# Patient Record
Sex: Male | Born: 1965 | Race: White | Hispanic: No | Smoking: Never smoker
Health system: Southern US, Community
[De-identification: ages and names within clinical notes are randomized; demographics above are authoritative.]

## PROBLEM LIST (undated history)

## (undated) DIAGNOSIS — G4700 Insomnia, unspecified: Secondary | ICD-10-CM

## (undated) DIAGNOSIS — Z8719 Personal history of other diseases of the digestive system: Secondary | ICD-10-CM

## (undated) DIAGNOSIS — E785 Hyperlipidemia, unspecified: Secondary | ICD-10-CM

## (undated) DIAGNOSIS — T7840XA Allergy, unspecified, initial encounter: Secondary | ICD-10-CM

## (undated) DIAGNOSIS — N5089 Other specified disorders of the male genital organs: Secondary | ICD-10-CM

## (undated) HISTORY — DX: Allergy, unspecified, initial encounter: T78.40XA

## (undated) HISTORY — DX: Hyperlipidemia, unspecified: E78.5

---

## 1988-09-16 HISTORY — PX: OTHER SURGICAL HISTORY: SHX169

## 1998-09-16 HISTORY — PX: KNEE ARTHROSCOPY: SUR90

## 2009-02-22 ENCOUNTER — Ambulatory Visit: Payer: Self-pay | Admitting: Gastroenterology

## 2009-02-22 DIAGNOSIS — K625 Hemorrhage of anus and rectum: Secondary | ICD-10-CM

## 2009-03-07 ENCOUNTER — Ambulatory Visit: Payer: Self-pay | Admitting: Gastroenterology

## 2011-12-24 ENCOUNTER — Other Ambulatory Visit: Payer: Self-pay | Admitting: Urology

## 2012-01-16 ENCOUNTER — Encounter (HOSPITAL_BASED_OUTPATIENT_CLINIC_OR_DEPARTMENT_OTHER): Payer: Self-pay | Admitting: *Deleted

## 2012-01-16 NOTE — Progress Notes (Signed)
NPO AFTER MN. ARRIVES AT 0830. NEEDS HG AND EKG. 

## 2012-01-17 NOTE — H&P (Signed)
History of Present Illness   Mr. Ruhlman presents today because of some acute scrotal swelling. The patient is 46 years of age. In the past he was seen for some nonspecific voiding issues which basically have resolved. Approximately 3 days ago he noticed a fairly painless swelling of his right hemi-scrotum. He went to an Urgent Care where he was told this was probably a "hydrocele". There has not been any dramatic change over the last few days. He has had mild discomfort attributable to the enlargement of the hemi-scrotum. He otherwise has not had any obvious pain. He has had no change in voiding patterns. There has been nothing to suggest epididymal orchitis. Urinalysis today is clear. No other complaints or issues at this point.      Past Medical History Problems  1. History of  No Medical Problems  Surgical History Problems  1. History of  Knee Surgery 2. History of  Shoulder Surgery  Current Meds 1. No Reported Medications  Allergies Medication  1. No Known Drug Allergies  Family History Problems  1. Family history of  No Significant Family History  Social History Problems  1. Caffeine Use 2 2. Marital History - Single 3. Never A Smoker 4. Occupation: Presenter, broadcasting  5. History of  Alcohol Use 6. History of  Tobacco Use  Review of Systems Genitourinary, constitutional, skin, eye, otolaryngeal, hematologic/lymphatic, cardiovascular, pulmonary, endocrine, musculoskeletal, gastrointestinal, neurological and psychiatric system(s) were reviewed and pertinent findings if present are noted.  Genitourinary: testicular mass, scrotal pain and scrotal swelling.    Vitals Vital Signs [Data Includes: Last 1 Day]  08Apr2013 03:38PM  BMI Calculated: 27.59 BSA Calculated: 2.24 Height: 6 ft 2 in Weight: 215 lb  Blood Pressure: 141 / 91 Temperature: 97 F Heart Rate: 57  Physical Exam Constitutional: Well nourished and well developed . No acute distress.  ENT:. The ears  and nose are normal in appearance.  Neck: The appearance of the neck is normal and no neck mass is present.  Pulmonary: No respiratory distress and normal respiratory rhythm and effort.  Cardiovascular: Heart rate and rhythm are normal . No peripheral edema.  Abdomen: The abdomen is soft and nontender. No masses are palpated. No CVA tenderness. No hernias are palpable. No hepatosplenomegaly noted.  Genitourinary: Examination of the penis demonstrates no discharge, no masses, no lesions and a normal meatus. The scrotum is without lesions. Examination of the right scrotum demonstrates a hydrocele. The right epididymis is found to have a spermatocele, but non-tender. The left epididymis is palpably normal and non-tender. The right testis is nonpalpable, non-tender and without masses. The left testis is normal, non-tender and without masses.  Skin: Normal skin turgor, no visible rash and no visible skin lesions.  Neuro/Psych:. Mood and affect are appropriate.    Results/Data Urine [Data Includes: Last 1 Day]   08Apr2013  COLOR STRAW   APPEARANCE CLEAR   SPECIFIC GRAVITY <1.005   pH 6.0   GLUCOSE NEG mg/dL  BILIRUBIN NEG   KETONE NEG mg/dL  BLOOD NEG   PROTEIN NEG mg/dL  UROBILINOGEN 0.2 mg/dL  NITRITE NEG   LEUKOCYTE ESTERASE NEG    Assessment Assessed  1. 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 Spermatocele 608.1 3. Testicular Pain 608.9 4. Inquiry And Counseling: Contracept. Natural Family Planning V25.09  Plan Spermatocele (608.1)  1. SCROTAL U/S  Done: 08Apr2013 12:00AM Testicular Hydrocele (603.9)  2. Follow-up Schedule Surgery Office  Follow-up  Done: 08Apr2013  Discussion/Summary   Mr. Ballinas presented with fairly acute  onset of right scrotal swelling. It is quite possible that this was somewhat swollen previously and became more acutely swollen over the last several days. He has not really had any significant pain or discomfort associated with this. On clinical exam one could feel  the posterior aspect to the testicle but not the entire testicle. This was more consistent with a large spermatocele versus a hydrocele. On ultrasonography, the patient clearly has a multiloculated fluid/cystic area superior to the right testicle and surrounding it except inferiorly and posteriorly. This measures as large as close to 6 cm. Both clinically as well as by ultrasound this is more consistent with a large spermatocele but a hydrocele is also a possibility. The underlying testicle looks normal and we are dealing with a benign process. Mr. Kenneth Fernandez is bothered by the scrotal swelling. He is told that this is unlikely to completely resolve on its own but I certainly think there is a chance and he ought to give this a couple of weeks to see if the swelling will indeed decrease. If not, then scrotal exploration with either hydrocele repair or excision of spermatocele can be performed. We did discuss the surgery and what is done in both cases. We talked a bit about postoperative recovery and issues/pros and cons of a surgical approach. He understands that nothing is absolutely essential for hydroceles or spermatocele, but when things are this large a surgical repair/correction is certainly quite a reasonable option and again, he is interested in that.    A total of 30 minutes were spent in the overall care of the patient today with 25 minutes in direct face to face consultation.  The patient was thoroughly counseled regarding permanent sterilization with vasectomy. The nature of the procedure itself and the periprocedure instructions were explained. The risks/potential complications and alternative options were discussed with the patient. Specific risks of vasectomy including but not limited to bleeding, infection, failure of the procedure/recanalization, sperm granuloma formation, epididymitis, chronic testicular pain, damage to other scrotal structures, etc. were discussed. The patient was also counseled that  two negative semen analyses were required after vasectomy before it would be appropriate to discontinue other forms of contraception.    Amendment  Pt. expressed interest in possible vas if above surgery is performed. Surgery is likely based on talk today.

## 2012-01-20 ENCOUNTER — Encounter (HOSPITAL_BASED_OUTPATIENT_CLINIC_OR_DEPARTMENT_OTHER)
Admission: RE | Disposition: A | Discharge status: Home / Self Care | Payer: Self-pay | Source: Ambulatory Visit | Attending: Urology

## 2012-01-20 ENCOUNTER — Encounter (HOSPITAL_BASED_OUTPATIENT_CLINIC_OR_DEPARTMENT_OTHER): Payer: Self-pay | Admitting: *Deleted

## 2012-01-20 ENCOUNTER — Encounter (HOSPITAL_BASED_OUTPATIENT_CLINIC_OR_DEPARTMENT_OTHER): Payer: Self-pay | Admitting: Anesthesiology

## 2012-01-20 ENCOUNTER — Ambulatory Visit (HOSPITAL_BASED_OUTPATIENT_CLINIC_OR_DEPARTMENT_OTHER)
Admission: RE | Admit: 2012-01-20 | Discharge: 2012-01-20 | Disposition: A | Discharge status: Home / Self Care | Payer: Managed Care, Other (non HMO) | Source: Ambulatory Visit | Attending: Urology | Admitting: Urology

## 2012-01-20 ENCOUNTER — Ambulatory Visit (HOSPITAL_BASED_OUTPATIENT_CLINIC_OR_DEPARTMENT_OTHER): Payer: Managed Care, Other (non HMO) | Admitting: Anesthesiology

## 2012-01-20 DIAGNOSIS — N4340 Spermatocele of epididymis, unspecified: Secondary | ICD-10-CM | POA: Diagnosis present

## 2012-01-20 DIAGNOSIS — N433 Hydrocele, unspecified: Secondary | ICD-10-CM | POA: Diagnosis present

## 2012-01-20 DIAGNOSIS — Z302 Encounter for sterilization: Secondary | ICD-10-CM | POA: Insufficient documentation

## 2012-01-20 DIAGNOSIS — N432 Other hydrocele: Secondary | ICD-10-CM | POA: Insufficient documentation

## 2012-01-20 HISTORY — DX: Other specified disorders of the male genital organs: N50.89

## 2012-01-20 HISTORY — PX: SPERMATOCELECTOMY: SHX2420

## 2012-01-20 HISTORY — PX: SCROTAL EXPLORATION: SHX2386

## 2012-01-20 HISTORY — PX: HYDROCELE EXCISION: SHX482

## 2012-01-20 HISTORY — DX: Insomnia, unspecified: G47.00

## 2012-01-20 HISTORY — PX: VASECTOMY: SHX75

## 2012-01-20 HISTORY — DX: Personal history of other diseases of the digestive system: Z87.19

## 2012-01-20 SURGERY — EXPLORATION, SCROTUM
Anesthesia: General | Site: Scrotum | Laterality: Right | Wound class: Clean

## 2012-01-20 MED ORDER — DEXAMETHASONE SODIUM PHOSPHATE 4 MG/ML IJ SOLN
INTRAMUSCULAR | Status: DC | PRN
  Administered 2012-01-20: 10 mg via INTRAVENOUS

## 2012-01-20 MED ORDER — BUPIVACAINE HCL (PF) 0.25 % IJ SOLN
INTRAMUSCULAR | Status: DC | PRN
  Administered 2012-01-20: 4 mL

## 2012-01-20 MED ORDER — CEFAZOLIN SODIUM-DEXTROSE 2-3 GM-% IV SOLR
2.0000 g | INTRAVENOUS | Status: AC
  Administered 2012-01-20: 2 g via INTRAVENOUS

## 2012-01-20 MED ORDER — LACTATED RINGERS IV SOLN
INTRAVENOUS | Status: DC
  Administered 2012-01-20 (×2): via INTRAVENOUS

## 2012-01-20 MED ORDER — MIDAZOLAM HCL 5 MG/5ML IJ SOLN
INTRAMUSCULAR | Status: DC | PRN
  Administered 2012-01-20: 2 mg via INTRAVENOUS

## 2012-01-20 MED ORDER — FENTANYL CITRATE 0.05 MG/ML IJ SOLN: 25.0000 ug | INTRAMUSCULAR | Status: DC | PRN

## 2012-01-20 MED ORDER — CEFAZOLIN SODIUM 1-5 GM-% IV SOLN: 1.0000 g | INTRAVENOUS | Status: DC

## 2012-01-20 MED ORDER — LACTATED RINGERS IV SOLN: INTRAVENOUS | Status: DC

## 2012-01-20 MED ORDER — PROMETHAZINE HCL 25 MG/ML IJ SOLN: 6.2500 mg | INTRAMUSCULAR | Status: DC | PRN

## 2012-01-20 MED ORDER — ONDANSETRON HCL 4 MG/2ML IJ SOLN
INTRAMUSCULAR | Status: DC | PRN
  Administered 2012-01-20: 4 mg via INTRAVENOUS

## 2012-01-20 MED ORDER — GLYCOPYRROLATE 0.2 MG/ML IJ SOLN
INTRAMUSCULAR | Status: DC | PRN
  Administered 2012-01-20 (×2): 0.2 mg via INTRAVENOUS

## 2012-01-20 MED ORDER — PROPOFOL 10 MG/ML IV EMUL
INTRAVENOUS | Status: DC | PRN
  Administered 2012-01-20: 300 mg via INTRAVENOUS

## 2012-01-20 MED ORDER — FENTANYL CITRATE 0.05 MG/ML IJ SOLN
INTRAMUSCULAR | Status: DC | PRN
  Administered 2012-01-20: 50 ug via INTRAVENOUS
  Administered 2012-01-20: 100 ug via INTRAVENOUS

## 2012-01-20 MED ORDER — LIDOCAINE HCL (CARDIAC) 20 MG/ML IV SOLN
INTRAVENOUS | Status: DC | PRN
  Administered 2012-01-20: 100 mg via INTRAVENOUS

## 2012-01-20 MED ORDER — MEPERIDINE HCL 25 MG/ML IJ SOLN: 6.2500 mg | INTRAMUSCULAR | Status: DC | PRN

## 2012-01-20 MED ORDER — OXYCODONE-ACETAMINOPHEN 5-500 MG PO CAPS: 1.0000 | ORAL_CAPSULE | ORAL | Status: AC | PRN

## 2012-01-20 SURGICAL SUPPLY — 53 items
APPLICATOR COTTON TIP 6IN STRL (MISCELLANEOUS) ×3 IMPLANT
BANDAGE GAUZE ELAST BULKY 4 IN (GAUZE/BANDAGES/DRESSINGS) ×3 IMPLANT
BENZOIN TINCTURE PRP APPL 2/3 (GAUZE/BANDAGES/DRESSINGS) ×3 IMPLANT
BLADE SURG 10 STRL SS (BLADE) ×3 IMPLANT
BLADE SURG 15 STRL LF DISP TIS (BLADE) ×2 IMPLANT
BLADE SURG 15 STRL SS (BLADE) ×1
BLADE SURG ROTATE 9660 (MISCELLANEOUS) IMPLANT
CANISTER SUCTION 1200CC (MISCELLANEOUS) ×3 IMPLANT
CANISTER SUCTION 2500CC (MISCELLANEOUS) IMPLANT
CLEANER CAUTERY TIP 5X5 PAD (MISCELLANEOUS) ×2 IMPLANT
CLOTH BEACON ORANGE TIMEOUT ST (SAFETY) ×3 IMPLANT
COVER MAYO STAND STRL (DRAPES) ×3 IMPLANT
COVER TABLE BACK 60X90 (DRAPES) ×3 IMPLANT
DISSECTOR ROUND CHERRY 3/8 STR (MISCELLANEOUS) IMPLANT
DRAIN PENROSE 18X1/4 LTX STRL (WOUND CARE) IMPLANT
DRAPE PED LAPAROTOMY (DRAPES) ×3 IMPLANT
DRSG TEGADERM 2-3/8X2-3/4 SM (GAUZE/BANDAGES/DRESSINGS) ×3 IMPLANT
DRSG TEGADERM 4X4.75 (GAUZE/BANDAGES/DRESSINGS) ×3 IMPLANT
ELECT NEEDLE TIP 2.8 STRL (NEEDLE) ×3 IMPLANT
ELECT REM PT RETURN 9FT ADLT (ELECTROSURGICAL) ×3
ELECTRODE REM PT RTRN 9FT ADLT (ELECTROSURGICAL) ×2 IMPLANT
GLOVE BIO SURGEON STRL SZ7.5 (GLOVE) ×3 IMPLANT
GOWN W/2 COTTON TOWELS 2 STD (GOWNS) ×3 IMPLANT
GOWN W/COTTON TOWEL STD LRG (GOWNS) ×3 IMPLANT
GOWN XL W/COTTON TOWEL STD (GOWNS) ×3 IMPLANT
LOOP VESSEL MAXI BLUE (MISCELLANEOUS) IMPLANT
NEEDLE HYPO 22GX1.5 SAFETY (NEEDLE) ×3 IMPLANT
NEEDLE HYPO 25X1 1.5 SAFETY (NEEDLE) ×3 IMPLANT
NS IRRIG 500ML POUR BTL (IV SOLUTION) ×3 IMPLANT
PACK BASIN DAY SURGERY FS (CUSTOM PROCEDURE TRAY) ×3 IMPLANT
PAD CLEANER CAUTERY TIP 5X5 (MISCELLANEOUS) ×1
PENCIL BUTTON HOLSTER BLD 10FT (ELECTRODE) ×3 IMPLANT
SUPPORT SCROTAL LG STRP (MISCELLANEOUS) ×3 IMPLANT
SUT MNCRL AB 4-0 PS2 18 (SUTURE) ×3 IMPLANT
SUT SILK 0 SH 30 (SUTURE) IMPLANT
SUT SILK 0 TIES 10X30 (SUTURE) ×3 IMPLANT
SUT SILK 3 0 SH 30 (SUTURE) IMPLANT
SUT VIC AB 2-0 CT1 27 (SUTURE)
SUT VIC AB 2-0 CT1 TAPERPNT 27 (SUTURE) IMPLANT
SUT VIC AB 3-0 CT1 36 (SUTURE) IMPLANT
SUT VIC AB 3-0 SH 27 (SUTURE) ×2
SUT VIC AB 3-0 SH 27X BRD (SUTURE) ×4 IMPLANT
SUT VIC AB 4-0 SH 27 (SUTURE) ×1
SUT VIC AB 4-0 SH 27XANBCTRL (SUTURE) ×2 IMPLANT
SUT VICRYL 0 TIES 12 18 (SUTURE) ×3 IMPLANT
SUT VICRYL 4-0 PS2 18IN ABS (SUTURE) ×3 IMPLANT
SYR BULB IRRIGATION 50ML (SYRINGE) ×3 IMPLANT
SYR CONTROL 10ML LL (SYRINGE) ×3 IMPLANT
TOWEL OR 17X24 6PK STRL BLUE (TOWEL DISPOSABLE) ×6 IMPLANT
TRAY DSU PREP LF (CUSTOM PROCEDURE TRAY) ×3 IMPLANT
TUBE CONNECTING 12X1/4 (SUCTIONS) ×3 IMPLANT
WATER STERILE IRR 500ML POUR (IV SOLUTION) IMPLANT
YANKAUER SUCT BULB TIP NO VENT (SUCTIONS) ×3 IMPLANT

## 2012-01-20 NOTE — Op Note (Signed)
Preoperative diagnosis: Right scrotal swelling possible hydrocele/possible spermatocele, request for elective sterilization Postoperative diagnosis: Same  Procedure: Scrotal exploration with repair of right hydrocele and excision of large right spermatocele, bilateral vasectomy   Surgeon: Valetta Fuller M.D.  Anesthesia: Gen.  Indications: The patient was seen in our office after developing are fairly acute right scrotal swelling. Ultrasound clearly showed a significant amount of fluid around the right testicle. By clinical exam as well as ultrasound was difficult to determine how much of this was a spermatocele versus a hydrocele. The patient elected to have this surgically corrected. He appeared to understand the advantages and disadvantages was potential risks. He also requested a bilateral vasectomy. Issues with regard to vasectomy were also discussed in detail.     Technique and findings: Patient was brought to the operating room where he had successful induction general anesthesia. He was placed in supine position prepped and draped in usual manner. PAS compression boots were utilized as well some perioperative antibiotics. Appropriate surgical timeout was performed. An incision was made in the median raphae of the scrotum the right hemiscrotal compartment was entered. A hydrocele sac was identified and appeared to be thin-walled. It was opened and drained and then the testicle was delivered out of the incision. The hydrocele itself was relatively small the patient had a very large 4-5 cm plus spermatocele involving the head of the epididymis on the right. We turned attention initially towards the left vas deferens. This was grasped through the incision. A small section of vas deferens was carefully cleared of its vasculature and then section was removed. Both ends were cauterized with a needle tip cautery and the more proximal vas was buried in a separate adventitial plane. We went ahead and  completed the vasectomy on the right testis as well in a similar an analogous manner and both vas sections were sent for pathologic confirmation. Attention was then turned towards the right spermatocele. Utilizing a combination of sharp as well as blunt dissected technique as also a needlepoint Bovie the cyst was carefully dissected free from the testis and epididymis as well spermatic cord structures. This large small to loculated cyst was removed in its entirety and sent for pathologic analysis. There were no concerning findings however appear blood supply to the testis appeared intact and the testis appeared viable and pink throughout. A small amount of redundant hydrocele sac was excised but not sent for pathology. The hydrocele was then plicated in a bottleneck manner to reduce risk of recurrence. Marcaine was instilled within the spermatic cord. Great care was taken to make sure that the testis was not twisted in any way and it was returned to the right hemiscrotum. The scrotal wall was then closed with multiple layers of Vicryl suture. Patient did not appear to have any obvious complications and was brought to recovery room in stable condition.

## 2012-01-20 NOTE — Discharge Instructions (Addendum)
Scrotal surgery postoperative instructions ° °Wound: ° °In most cases your incision will have absorbable sutures that will dissolve within the first 10-20 days. Some will fall out even earlier. Expect some redness as the sutures dissolved but this should occur only around the sutures. If there is generalized redness, especially with increasing pain or swelling, let us know. The scrotum will very likely get "black and blue" as the blood in the tissues spread. Sometimes the whole scrotum will turn colors. The black and blue is followed by a yellow and brown color. In time, all the discoloration will go away. In some cases some firm swelling in the area of the testicle may persist for up to 4-6 weeks after the surgery and is considered normal in most cases. ° °Diet: ° °You may return to your normal diet within 24 hours following your surgery. You may note some mild nausea and possibly vomiting the first 6-8 hours following surgery. This is usually due to the side effects of anesthesia, and will disappear quite soon. I would suggest clear liquids and a very light meal the first evening following your surgery. ° °Activity: ° °Your physical activity should be restricted the first 48 hours. During that time you should remain relatively inactive, moving about only when necessary. During the first 7-10 days following surgery he should avoid lifting any heavy objects (anything greater than 15 pounds), and avoid strenuous exercise. If you work, ask us specifically about your restrictions, both for work and home. We will write a note to your employer if needed. ° °You should plan to wear a tight pair of jockey shorts or an athletic supporter for the first 4-5 days, even to sleep. This will keep the scrotum immobilized to some degree and keep the swelling down. ° °Ice packs should be placed on and off over the scrotum for the first 48 hours. Frozen peas or corn in a ZipLock bag can be frozen, used and re-frozen. Fifteen minutes  on and 15 minutes off is a reasonable schedule. The ice is a good pain reliever and keeps the swelling down. ° °Hygiene: ° °You may shower 48 hours after your surgery. Tub bathing should be restricted until the seventh day. ° ° ° ° ° ° ° ° ° °Medication: ° °You will be sent home with some type of pain medication. In many cases you will be sent home with a narcotic pain pill (Vicodin or Tylox). If the pain is not too bad, you may take either Tylenol (acetaminophen) or Advil (ibuprofen) which contain no narcotic agents, and might be tolerated a little better, with fewer side effects. If the pain medication you are sent home with does not control the pain, you will have to let us know. Some narcotic pain medications cannot be given or refilled by a phone call to a pharmacy. ° °Problems you should report to us: ° °· Fever of 101.0 degrees Fahrenheit or greater. °· Moderate or severe swelling under the skin incision or involving the scrotum. °· Drug reaction such as hives, a rash, nausea or vomiting ° ° °Post Anesthesia Home Care Instructions ° °Activity: °Get plenty of rest for the remainder of the day. A responsible adult should stay with you for 24 hours following the procedure.  °For the next 24 hours, DO NOT: °-Drive a car °-Operate machinery °-Drink alcoholic beverages °-Take any medication unless instructed by your physician °-Make any legal decisions or sign important papers. ° °Meals: °Start with liquid foods such as gelatin or   soup. Progress to regular foods as tolerated. Avoid greasy, spicy, heavy foods. If nausea and/or vomiting occur, drink only clear liquids until the nausea and/or vomiting subsides. Call your physician if vomiting continues. ° °Special Instructions/Symptoms: °Your throat may feel dry or sore from the anesthesia or the breathing tube placed in your throat during surgery. If this causes discomfort, gargle with warm salt water. The discomfort should disappear within 24 hours. ° °

## 2012-01-20 NOTE — Transfer of Care (Signed)
Immediate Anesthesia Transfer of Care Note  Patient: Kenneth Fernandez.  Procedure(s) Performed: Procedure(s) (LRB): SCROTUM EXPLORATION (Right) HYDROCELECTOMY ADULT (Right) SPERMATOCELECTOMY (Right) VASECTOMY (Bilateral)  Patient Location: PACU  Anesthesia Type: General  Level of Consciousness:drowsy, arouses to name, follows commands  Airway & Oxygen Therapy: Patient Spontanous Breathing and Patient connected to nasal cannula  Post-op Assessment: Report given to PACU RN and Post -op Vital signs reviewed and stable  Post vital signs: Reviewed and stable  Complications: No apparent anesthesia complications

## 2012-01-20 NOTE — Interval H&P Note (Signed)
History and Physical Interval Note:  01/20/2012 9:35 AM  Kenneth Fernandez.  has presented today for surgery, with the diagnosis of RIGHT SCROTAL SWELLING  The various methods of treatment have been discussed with the patient and family. After consideration of risks, benefits and other options for treatment, the patient has consented to  Procedure(s) (LRB): SCROTUM EXPLORATION (Right) HYDROCELECTOMY ADULT (Right) SPERMATOCELECTOMY (Right) VASECTOMY (N/A) as a surgical intervention .  The patients' history has been reviewed, patient examined, no change in status, stable for surgery.  I have reviewed the patients' chart and labs.  Questions were answered to the patient's satisfaction.     Harden Bramer S

## 2012-01-20 NOTE — Anesthesia Procedure Notes (Addendum)
Procedure Name: LMA Insertion Date/Time: 01/20/2012 9:58 AM Performed by: Norva Pavlov Pre-anesthesia Checklist: Patient identified, Emergency Drugs available, Suction available and Patient being monitored Patient Re-evaluated:Patient Re-evaluated prior to inductionOxygen Delivery Method: Circle System Utilized Preoxygenation: Pre-oxygenation with 100% oxygen Intubation Type: IV induction Ventilation: Mask ventilation without difficulty LMA: LMA inserted LMA Size: 5.0 Number of attempts: 1 Airway Equipment and Method: bite block Placement Confirmation: positive ETCO2 Tube secured with: Tape Dental Injury: Teeth and Oropharynx as per pre-operative assessment

## 2012-01-20 NOTE — Anesthesia Postprocedure Evaluation (Signed)
  Anesthesia Post-op Note  Patient: Kenneth Fernandez.  Procedure(s) Performed: Procedure(s) (LRB): SCROTUM EXPLORATION (Right) HYDROCELECTOMY ADULT (Right) SPERMATOCELECTOMY (Right) VASECTOMY (Bilateral)  Patient Location: PACU  Anesthesia Type: General  Level of Consciousness: awake and alert   Airway and Oxygen Therapy: Patient Spontanous Breathing  Post-op Pain: mild  Post-op Assessment: Post-op Vital signs reviewed, Patient's Cardiovascular Status Stable, Respiratory Function Stable, Patent Airway and No signs of Nausea or vomiting  Post-op Vital Signs: stable  Complications: No apparent anesthesia complications

## 2012-01-20 NOTE — Anesthesia Preprocedure Evaluation (Signed)

## 2012-01-21 ENCOUNTER — Encounter (HOSPITAL_BASED_OUTPATIENT_CLINIC_OR_DEPARTMENT_OTHER): Payer: Self-pay | Admitting: Urology

## 2013-01-06 ENCOUNTER — Other Ambulatory Visit: Payer: Self-pay | Admitting: Dermatology

## 2013-08-03 ENCOUNTER — Encounter: Payer: Self-pay | Admitting: Internal Medicine

## 2013-08-03 DIAGNOSIS — T7840XA Allergy, unspecified, initial encounter: Secondary | ICD-10-CM | POA: Insufficient documentation

## 2013-08-03 DIAGNOSIS — E785 Hyperlipidemia, unspecified: Secondary | ICD-10-CM | POA: Insufficient documentation

## 2013-08-04 ENCOUNTER — Encounter: Payer: Self-pay | Admitting: Emergency Medicine

## 2014-06-17 ENCOUNTER — Encounter: Payer: Self-pay | Admitting: Gastroenterology

## 2014-07-05 ENCOUNTER — Emergency Department (HOSPITAL_COMMUNITY)
Admission: EM | Admit: 2014-07-05 | Discharge: 2014-07-05 | Disposition: A | Discharge status: Home / Self Care | Payer: Managed Care, Other (non HMO) | Attending: Emergency Medicine | Admitting: Emergency Medicine

## 2014-07-05 ENCOUNTER — Encounter (HOSPITAL_COMMUNITY): Payer: Self-pay | Admitting: Emergency Medicine

## 2014-07-05 ENCOUNTER — Emergency Department (HOSPITAL_COMMUNITY): Payer: Managed Care, Other (non HMO)

## 2014-07-05 ENCOUNTER — Encounter: Payer: Self-pay | Admitting: Emergency Medicine

## 2014-07-05 ENCOUNTER — Ambulatory Visit (INDEPENDENT_AMBULATORY_CARE_PROVIDER_SITE_OTHER): Payer: Managed Care, Other (non HMO) | Admitting: Emergency Medicine

## 2014-07-05 VITALS — BP 138/82 | HR 70 | Temp 98.6°F | Resp 18 | Ht 74.5 in | Wt 221.0 lb

## 2014-07-05 DIAGNOSIS — Z8719 Personal history of other diseases of the digestive system: Secondary | ICD-10-CM | POA: Insufficient documentation

## 2014-07-05 DIAGNOSIS — R079 Chest pain, unspecified: Secondary | ICD-10-CM | POA: Diagnosis present

## 2014-07-05 DIAGNOSIS — Z87448 Personal history of other diseases of urinary system: Secondary | ICD-10-CM | POA: Diagnosis not present

## 2014-07-05 DIAGNOSIS — R0789 Other chest pain: Secondary | ICD-10-CM | POA: Diagnosis not present

## 2014-07-05 DIAGNOSIS — Z8639 Personal history of other endocrine, nutritional and metabolic disease: Secondary | ICD-10-CM | POA: Insufficient documentation

## 2014-07-05 DIAGNOSIS — Z8669 Personal history of other diseases of the nervous system and sense organs: Secondary | ICD-10-CM | POA: Insufficient documentation

## 2014-07-05 DIAGNOSIS — Z79899 Other long term (current) drug therapy: Secondary | ICD-10-CM | POA: Insufficient documentation

## 2014-07-05 LAB — I-STAT TROPONIN, ED: TROPONIN I, POC: 0.01 ng/mL (ref 0.00–0.08)

## 2014-07-05 LAB — CBC
HCT: 44.6 % (ref 39.0–52.0)
Hemoglobin: 15.8 g/dL (ref 13.0–17.0)
MCH: 29.9 pg (ref 26.0–34.0)
MCHC: 35.4 g/dL (ref 30.0–36.0)
MCV: 84.5 fL (ref 78.0–100.0)
PLATELETS: 247 10*3/uL (ref 150–400)
RBC: 5.28 MIL/uL (ref 4.22–5.81)
RDW: 12.2 % (ref 11.5–15.5)
WBC: 6.6 10*3/uL (ref 4.0–10.5)

## 2014-07-05 LAB — BASIC METABOLIC PANEL
ANION GAP: 12 (ref 5–15)
BUN: 20 mg/dL (ref 6–23)
CHLORIDE: 101 meq/L (ref 96–112)
CO2: 26 meq/L (ref 19–32)
Calcium: 9.6 mg/dL (ref 8.4–10.5)
Creatinine, Ser: 0.92 mg/dL (ref 0.50–1.35)
GFR calc non Af Amer: >90 mL/min (ref >90)
Glucose, Bld: 92 mg/dL (ref 70–99)
POTASSIUM: 4 meq/L (ref 3.7–5.3)
SODIUM: 139 meq/L (ref 137–147)

## 2014-07-05 MED ORDER — IBUPROFEN 400 MG PO TABS: 400.0000 mg | ORAL_TABLET | Freq: Four times a day (QID) | ORAL | Status: AC | PRN

## 2014-07-05 NOTE — Progress Notes (Signed)
   Subjective:    Patient ID: Kenneth Kernichard M Dlugosz Jr., male    DOB: 11/12/1965, 48 y.o.   MRN: [id number]  HPI Comments: 48 yo with intermittent CP on/ off this week. He notes pain this morning increased with intense sharp left sided on/off. He does have family history of heart disease  Chest Pain      Medication List       This list is accurate as of: 07/05/14  2:57 PM.  Always use your most recent med list.               cetirizine 10 MG tablet  Commonly known as:  ZYRTEC  Take 10 mg by mouth daily.     fluticasone 50 MCG/ACT nasal spray  Commonly known as:  FLONASE  Place 2 sprays into the nose daily.     Melatonin 5 MG Caps  Take by mouth at bedtime as needed.       No Known Allergies  Past Medical History  Diagnosis Date  . Scrotal swelling RIGHT  . Insomnia   . History of anal fissures   . Hyperlipidemia   . Allergy       Review of Systems  Cardiovascular: Positive for chest pain.  All other systems reviewed and are negative.  BP 138/82  Pulse 70  Temp(Src) 98.6 F (37 C) (Temporal)  Resp 18  Ht 6' 2.5" (1.892 m)  Wt 221 lb (100.245 kg)  BMI 28.00 kg/m2     Objective:   Physical Exam  Nursing note and vitals reviewed. Constitutional: He is oriented to person, place, and time. He appears well-developed and well-nourished.  HENT:  Head: Normocephalic and atraumatic.  Right Ear: External ear normal.  Left Ear: External ear normal.  Nose: Nose normal.  Eyes: Conjunctivae and EOM are normal.  Neck: Normal range of motion. No JVD present.  Cardiovascular: Normal rate, regular rhythm, normal heart sounds and intact distal pulses.   Pulmonary/Chest: Effort normal and breath sounds normal.  Musculoskeletal: Normal range of motion. He exhibits no edema and no tenderness.  Lymphadenopathy:    He has no cervical adenopathy.  Neurological: He is alert and oriented to person, place, and time. No cranial nerve deficit. Coordination normal.  Skin: Skin is  warm and dry. No rash noted.  Psychiatric: He has a normal mood and affect. His behavior is normal. Judgment and thought content normal.      EKG ? Change lead 2    Assessment & Plan:  Chest pain- refer to ER for further evaluation with family history of cardiac disease

## 2014-07-05 NOTE — ED Notes (Signed)
Pt remains monitored by blood pressure, pulse ox, and 5 lead.  

## 2014-07-05 NOTE — ED Provider Notes (Signed)
CSN: [id number]     Arrival date & time 07/05/14  1528 History   First MD Initiated Contact with Patient 07/05/14 2111     Chief Complaint  Patient presents with  . Chest Pain   Patient is a 48 y.o. male presenting with chest pain. The history is provided by the patient.  Chest Pain Pain location:  L chest Pain quality: sharp   Pain radiates to:  Does not radiate Pain radiates to the back: no   Pain severity:  Moderate Onset quality:  Sudden Duration: A few seconds. Timing:  Intermittent Progression:  Waxing and waning Chronicity:  New Context comment:  None in particular Relieved by:  Nothing Worsened by:  Nothing tried Ineffective treatments:  None tried Associated symptoms: no abdominal pain, no anxiety, no back pain, no cough, no fever, no headache, no heartburn, no nausea, no palpitations, no shortness of breath and not vomiting   Risk factors: no coronary artery disease, no diabetes mellitus, no high cholesterol, no hypertension and no smoking    48 year old male presents with intermittent left-sided sharp chest pain. Patient states that he has had these episodes intermittently over the last 3 days. He did not have any yesterday. Yesterday he was at the gym and was on a stairmaster for 45 minutes with no chest pain. Patient currently denies chest pain. Patient states he went to his PCP for this pain and was told he had an abnormal EKG and was instructed to come to the ED for further management. Patient's dad died of congestive heart failure. However he has no first degree relatives with CAD. Patient denies HTN, DM, HLD, or tobacco abuse. Patient does weightlifting.  Past Medical History  Diagnosis Date  . Scrotal swelling RIGHT  . Insomnia   . History of anal fissures   . Hyperlipidemia   . Allergy    Past Surgical History  Procedure Laterality Date  . Knee arthroscopy  2000    RIGHT  . Bilateral shoulder athroscopy  1990  . Scrotal exploration  01/20/2012    Procedure:  SCROTUM EXPLORATION;  Surgeon: Valetta Fulleravid S Grapey, MD;  [facility] [place];  Service: Urology;  Laterality: Right;  . Hydrocele excision  01/20/2012    Procedure: HYDROCELECTOMY ADULT;  Surgeon: Valetta Fulleravid S Grapey, MD;  [facility] [place];  Service: Urology;  Laterality: Right;  . Spermatocelectomy  01/20/2012    Procedure: SPERMATOCELECTOMY;  Surgeon: Valetta Fulleravid S Grapey, MD;  [facility] [place];  Service: Urology;  Laterality: Right;  . Vasectomy  01/20/2012    Procedure: VASECTOMY;  Surgeon: Valetta Fulleravid S Grapey, MD;  [facility] [place];  Service: Urology;  Laterality: Bilateral;   Family History  Problem Relation Age of Onset  . Cancer Mother   . Hypertension Father   . Heart disease Father   . Hypertension Brother    History  Substance Use Topics  . Smoking status: Never Smoker   . Smokeless tobacco: Never Used  . Alcohol Use: No    Review of Systems  Constitutional: Negative for fever and chills.  HENT: Negative for rhinorrhea and sore throat.   Eyes: Negative for visual disturbance.  Respiratory: Negative for cough and shortness of breath.   Cardiovascular: Positive for chest pain. Negative for palpitations.  Gastrointestinal: Negative for heartburn, nausea, vomiting, abdominal pain, diarrhea and constipation.  Genitourinary: Negative for dysuria and hematuria.  Musculoskeletal: Negative for back pain and neck pain.  Skin: Negative for rash.  Neurological: Negative  for syncope and headaches.  Psychiatric/Behavioral: Negative for confusion.  All other systems reviewed and are negative.  Allergies  Review of patient's allergies indicates no known allergies.  Home Medications   Prior to Admission medications   Medication Sig Start Date End Date Taking? Authorizing Provider  cetirizine (ZYRTEC) 10 MG tablet Take 10 mg by mouth daily.   Yes Historical Provider, MD   BP 137/91  Pulse 77  Temp(Src) 97.9 F (36.6 C) (Oral)   Resp 18  SpO2 98% Physical Exam  Constitutional: He is oriented to person, place, and time. He appears well-developed and well-nourished. No distress.  HENT:  Head: Normocephalic and atraumatic.  Mouth/Throat: Oropharynx is clear and moist.  Eyes: EOM are normal.  Neck: Neck supple. No JVD present.  Cardiovascular: Normal rate, regular rhythm, normal heart sounds and intact distal pulses.   Pulmonary/Chest: Effort normal and breath sounds normal.  Abdominal: Soft. He exhibits no distension. There is no tenderness.  Musculoskeletal: Normal range of motion. He exhibits no edema.  Neurological: He is alert and oriented to person, place, and time. No cranial nerve deficit.  Skin: Skin is warm and dry.  Psychiatric: His behavior is normal.    ED Course  Procedures  None   Labs Review Labs Reviewed  CBC  BASIC METABOLIC PANEL  Rosezena SensorI-STAT TROPOININ, ED    Imaging Review Dg Chest 2 View  07/05/2014   CLINICAL DATA:  Chest pain.  EXAM: CHEST  2 VIEW  COMPARISON:  None.  FINDINGS: The heart size and mediastinal contours are within normal limits. Both lungs are clear. No pneumothorax or pleural effusion is noted. The visualized skeletal structures are unremarkable.  IMPRESSION: No acute cardiopulmonary abnormality seen.   Electronically Signed   By: Roque LiasJames  Green M.D.   On: 07/05/2014 17:09     EKG Interpretation   Date/Time:  Tuesday July 05 2014 15:44:22 EDT Ventricular Rate:  63 PR Interval:  174 QRS Duration: 90 QT Interval:  398 QTC Calculation: 407 R Axis:   53 Text Interpretation:  Normal sinus rhythm with sinus arrhythmia Septal  infarct , age undetermined Abnormal ECG Confirmed by ALLEN  MD, ANTHONY  ([id number]) on 07/05/2014 10:24:08 PM      MDM   Final diagnoses:  Atypical chest pain   Otherwise healthy 48 year old no presents with left-sided atypical chest pain. He is very well appearing on exam. React and pulmonary exam unremarkable.  EKG with NSR, rate 63, no ST  elevation or depression, QTc 407, no ectopy. Trop neg. CXR unremarkable. Hb wnl. Very low suspicion this is cardiac in nature. Patient instructed to take Tylenol or Motrin if symptoms persist and to followup with his PCP as needed. I feel patient stable for discharge at this time.  Case discussed with Dr. Freida BusmanAllen.     Maris BergerJonah Rajeev Escue, MD 07/05/14 2233

## 2014-07-05 NOTE — ED Notes (Signed)
Pt is here with left intermittent chest pain for the last couple of days, not induced with exercise, no sob or diaphoresis.  Pt has family history of heart disease in his family and sent here with abnormal EKG

## 2014-07-05 NOTE — ED Notes (Signed)
Gave pt gingerale and [place] sandwich with MD approval.

## 2014-07-05 NOTE — Discharge Instructions (Signed)

## 2014-07-05 NOTE — ED Notes (Signed)
Pt placed into gown and on monitor upon arrival to room. Pt monitored by blood pressure, pulse ox, and 5 lead.  

## 2014-07-08 NOTE — ED Provider Notes (Signed)
I saw and evaluated the patient, reviewed the resident's note and I agree with the findings and plan.  Toy BakerAnthony T Noah Lembke, MD 07/08/14 [id number]

## 2014-08-04 ENCOUNTER — Encounter: Payer: Self-pay | Admitting: Emergency Medicine

## 2016-05-03 ENCOUNTER — Encounter: Payer: Self-pay | Admitting: *Deleted

## 2017-03-31 ENCOUNTER — Other Ambulatory Visit (HOSPITAL_COMMUNITY): Payer: Self-pay | Admitting: Urology

## 2017-03-31 DIAGNOSIS — N1330 Unspecified hydronephrosis: Secondary | ICD-10-CM

## 2017-03-31 DIAGNOSIS — Q6239 Other obstructive defects of renal pelvis and ureter: Secondary | ICD-10-CM

## 2017-03-31 DIAGNOSIS — Q6211 Congenital occlusion of ureteropelvic junction: Principal | ICD-10-CM

## 2017-04-09 ENCOUNTER — Encounter (HOSPITAL_COMMUNITY)
Admission: RE | Admit: 2017-04-09 | Discharge: 2017-04-09 | Disposition: A | Discharge status: Home / Self Care | Payer: 59 | Source: Ambulatory Visit | Attending: Urology | Admitting: Urology

## 2017-04-09 DIAGNOSIS — N1330 Unspecified hydronephrosis: Secondary | ICD-10-CM | POA: Diagnosis present

## 2017-04-09 DIAGNOSIS — Q6211 Congenital occlusion of ureteropelvic junction: Secondary | ICD-10-CM | POA: Insufficient documentation

## 2017-04-09 DIAGNOSIS — Q6239 Other obstructive defects of renal pelvis and ureter: Secondary | ICD-10-CM

## 2017-04-09 MED ORDER — FUROSEMIDE 10 MG/ML IJ SOLN
50.0000 mg | Freq: Once | INTRAMUSCULAR | Status: AC
  Administered 2017-04-09: 50 mg via INTRAVENOUS

## 2017-04-09 MED ORDER — TECHNETIUM TC 99M MERTIATIDE
4.9000 | Freq: Once | INTRAVENOUS | Status: AC | PRN
  Administered 2017-04-09: 4.9 via INTRAVENOUS

## 2017-04-09 MED ORDER — FUROSEMIDE 10 MG/ML IJ SOLN
INTRAMUSCULAR | Status: AC
  Filled 2017-04-09: qty 8

## 2019-02-08 ENCOUNTER — Encounter: Payer: Self-pay | Admitting: Gastroenterology

## 2019-02-27 IMAGING — NM NM RENAL IMAGING FLOW W/ PHARM
2 series · 12 of 12 positions shown · non-contrast
Comparison: None; correlation CT abdomen and pelvis 11/16/2015

CLINICAL DATA: Congenital RIGHT UPJ obstruction an hydronephrosis

EXAM:
NUCLEAR MEDICINE RENAL SCAN WITH DIURETIC ADMINISTRATION
TECHNIQUE: Radionuclide angiographic and sequential renal images were obtained
after intravenous injection of radiopharmaceutical. Imaging was
continued during slow intravenous injection of Lasix approximately
15 minutes after the start of the examination.
RADIOPHARMACEUTICALS:  4.9 mCi Mechnetium-GGm MAG3 IV
Pharmaceutical:  50 mg Lasix IV

[Series 1: renal scan · 4.14mm/px · 6 of 40 frames shown (1 of 2)]
[frame 4/40  full-range]
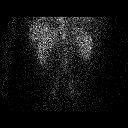
[frame 10/40  full-range]
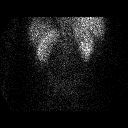
[frame 17/40  full-range]
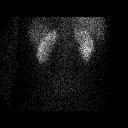
[frame 24/40  full-range]
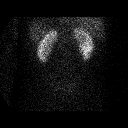
[frame 30/40  full-range]
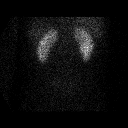
[frame 37/40  full-range]
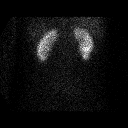

[Series 1: renal scan · 4.14mm/px · 6 of 43 frames shown (2 of 2)]
[frame 4/43]
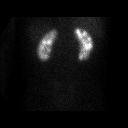
[frame 11/43]
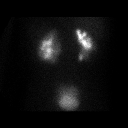
[frame 18/43]
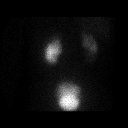
[frame 25/43]
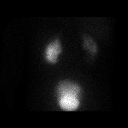
[frame 32/43]
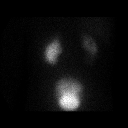
[frame 40/43]
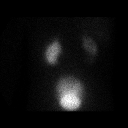

[12 of 12 positions shown; findings below may reference images not displayed]

FINDINGS: Flow:  Prompt symmetric arterial flow to the kidneys.

Left renogram: Normal uptake, concentration and excretion of tracer
by LEFT kidney. Peak activity at 7.7 minutes, likely related to
early retention of tracer within the dilated collecting system.
Adequate response to diuretic administration with fall to half
maximum activity.

Right renogram: Normal uptake, concentration and excretion of tracer
by RIGHT kidney. Peak activity at 7.2 minutes. Normal fall to half
maximum activity following diuretic administration.

Differential:

Left kidney = 46 %

Right kidney = 54 %

T1/2 post Lasix :

Left kidney = 
12 min

Right kidney = 5 min
IMPRESSION: Mildly dilated LEFT renal collecting system.

Normal washout of tracer from both kidneys following diuretic
administration indicating a lack of significant urinary outflow
obstruction.

## 2019-03-17 DEATH — deceased
